# Patient Record
Sex: Male | Born: 1955
Health system: Southern US, Community
[De-identification: ages and names within clinical notes are randomized; demographics above are authoritative.]

## PROBLEM LIST (undated history)

## (undated) DIAGNOSIS — I10 Essential (primary) hypertension: Secondary | ICD-10-CM

---

## 1997-12-18 ENCOUNTER — Inpatient Hospital Stay (HOSPITAL_COMMUNITY): Admission: RE | Admit: 1997-12-18 | Discharge: 1997-12-22 | Payer: Self-pay | Admitting: Orthopedic Surgery

## 2001-02-25 ENCOUNTER — Ambulatory Visit (HOSPITAL_COMMUNITY): Admission: RE | Admit: 2001-02-25 | Discharge: 2001-02-25 | Payer: Self-pay | Admitting: Family Medicine

## 2001-02-25 ENCOUNTER — Encounter: Payer: Self-pay | Admitting: Family Medicine

## 2005-10-13 ENCOUNTER — Encounter: Admission: RE | Admit: 2005-10-13 | Discharge: 2005-10-13 | Payer: Self-pay | Admitting: Orthopedic Surgery

## 2014-01-17 ENCOUNTER — Ambulatory Visit: Payer: Self-pay

## 2017-02-06 ENCOUNTER — Other Ambulatory Visit: Payer: Self-pay | Admitting: Acute Care

## 2017-02-06 DIAGNOSIS — F17210 Nicotine dependence, cigarettes, uncomplicated: Secondary | ICD-10-CM

## 2017-02-10 ENCOUNTER — Encounter: Payer: Self-pay | Admitting: Acute Care

## 2017-02-10 ENCOUNTER — Ambulatory Visit (INDEPENDENT_AMBULATORY_CARE_PROVIDER_SITE_OTHER): Payer: Medicare Other | Admitting: Acute Care

## 2017-02-10 ENCOUNTER — Ambulatory Visit (INDEPENDENT_AMBULATORY_CARE_PROVIDER_SITE_OTHER)
Admission: RE | Admit: 2017-02-10 | Discharge: 2017-02-10 | Disposition: A | Discharge status: Home / Self Care | Payer: Medicare Other | Source: Ambulatory Visit | Attending: Acute Care | Admitting: Acute Care

## 2017-02-10 DIAGNOSIS — Z87891 Personal history of nicotine dependence: Secondary | ICD-10-CM

## 2017-02-10 DIAGNOSIS — F17210 Nicotine dependence, cigarettes, uncomplicated: Secondary | ICD-10-CM

## 2017-02-10 NOTE — Progress Notes (Signed)
Shared Decision Making Visit Lung Cancer Screening Program [phone])   Eligibility:  Age 61 y.o.  Pack Years Smoking History Calculation 75 pack year smoking histpry (# packs/per year x # years smoked)  Recent History of coughing up blood  no  Unexplained weight loss? no ( >Than 15 pounds within the last 6 months )  Prior History Lung / other cancer no (Diagnosis within the last 5 years already requiring surveillance chest CT Scans).  Smoking Status Current Smoker  Former Smokers: Years since quit: NA  Quit Date: NA  Visit Components:  Discussion included one or more decision making aids. yes  Discussion included risk/benefits of screening. yes  Discussion included potential follow up diagnostic testing for abnormal scans. yes  Discussion included meaning and risk of over diagnosis. yes  Discussion included meaning and risk of False Positives. yes  Discussion included meaning of total radiation exposure. yes  Counseling Included:  Importance of adherence to annual lung cancer LDCT screening. yes  Impact of comorbidities on ability to participate in the program. yes  Ability and willingness to under diagnostic treatment. yes  Smoking Cessation Counseling:  Current Smokers:   Discussed importance of smoking cessation. yes  Information about tobacco cessation classes and interventions provided to patient. yes  Patient provided with "ticket" for LDCT Scan. yes  Symptomatic Patient. no  Counseling  Diagnosis Code: Tobacco Use Z72.0  Asymptomatic Patient yes  Counseling (Intermediate counseling: > three minutes counseling) T0354  Former Smokers:   Discussed the importance of maintaining cigarette abstinence. yes  Diagnosis Code: Personal History of Nicotine Dependence. S56.812  Information about tobacco cessation classes and interventions provided to patient. Yes  Patient provided with "ticket" for LDCT Scan. yes  Written Order for Lung Cancer  Screening with LDCT placed in Epic. Yes (CT Chest Lung Cancer Screening Low Dose W/O CM) [id number] Z12.2-Screening of respiratory organs Z87.891-Personal history of nicotine dependence  I have spent 25 minutes of face to face time with Mr. Dimond discussing the risks and benefits of lung cancer screening. We viewed a power point together that explained in detail the above noted topics. We paused at intervals to allow for questions to be asked and answered to ensure understanding.We discussed that the single most powerful action that he can take to decrease his risk of developing lung cancer is to quit smoking. We discussed whether or not he is ready to commit to setting a quit date.He is currently not ready to set a quit date.  We discussed options for tools to aid in quitting smoking including nicotine replacement therapy, non-nicotine medications, support groups, Quit Smart classes, and behavior modification. We discussed that often times setting smaller, more achievable goals, such as eliminating 1 cigarette a day for a week and then 2 cigarettes a day for a week can be helpful in slowly decreasing the number of cigarettes smoked. This allows for a sense of accomplishment as well as providing a clinical benefit. I gave Mr. Twyford  the " Be Stronger Than Your Excuses" card with contact information for community resources, classes, free nicotine replacement therapy, and access to mobile apps, text messaging, and on-line smoking cessation help. I have also given him my card and contact information in the event he needs to contact me. We discussed the time and location of the scan, and that either Doroteo Glassman RN or I will call with the results within 24-48 hours of receiving them. I have offered him   a copy of the power point  we viewed  as a resource in the event they need reinforcement of the concepts we discussed today in the office. The patient verbalized understanding of all of  the above and had no  further questions upon leaving the office. They have my contact information in the event they have any further questions.  I spent 4 minutes counseling on smoking cessation and the health risks of continued tobacco abuse.  I explained to the patient that there has been a high incidence of coronary artery disease noted on these exams. I explained that this is a non-gated exam therefore degree or severity cannot be determined. This patient is not currently on statin therapy. I have asked the patient to follow-up with their PCP regarding any incidental finding of coronary artery disease and management with diet or medication as their PCP  feels is clinically indicated. The patient verbalized understanding of the above and had no further questions upon completion of the visit.    Magdalen Spatz, NP 02/10/2017

## 2017-02-16 NOTE — Progress Notes (Signed)
ATC pt - no answer and no option to LM.

## 2017-02-28 ENCOUNTER — Other Ambulatory Visit: Payer: Self-pay | Admitting: Acute Care

## 2017-02-28 ENCOUNTER — Encounter: Payer: Self-pay | Admitting: *Deleted

## 2017-02-28 DIAGNOSIS — Z122 Encounter for screening for malignant neoplasm of respiratory organs: Secondary | ICD-10-CM

## 2017-02-28 DIAGNOSIS — F17210 Nicotine dependence, cigarettes, uncomplicated: Secondary | ICD-10-CM

## 2017-04-05 ENCOUNTER — Other Ambulatory Visit: Payer: Self-pay | Admitting: Otolaryngology

## 2017-04-05 DIAGNOSIS — R221 Localized swelling, mass and lump, neck: Secondary | ICD-10-CM

## 2017-04-12 ENCOUNTER — Ambulatory Visit
Admission: RE | Admit: 2017-04-12 | Discharge: 2017-04-12 | Disposition: A | Discharge status: Home / Self Care | Payer: Medicare Other | Source: Ambulatory Visit | Attending: Otolaryngology | Admitting: Otolaryngology

## 2017-04-12 DIAGNOSIS — R221 Localized swelling, mass and lump, neck: Secondary | ICD-10-CM

## 2017-04-12 MED ORDER — IOPAMIDOL (ISOVUE-300) INJECTION 61%
75.0000 mL | Freq: Once | INTRAVENOUS | Status: AC | PRN
  Administered 2017-04-12: 75 mL via INTRAVENOUS

## 2017-07-31 DIAGNOSIS — R591 Generalized enlarged lymph nodes: Secondary | ICD-10-CM | POA: Diagnosis not present

## 2017-07-31 DIAGNOSIS — J449 Chronic obstructive pulmonary disease, unspecified: Secondary | ICD-10-CM | POA: Diagnosis not present

## 2017-07-31 DIAGNOSIS — F331 Major depressive disorder, recurrent, moderate: Secondary | ICD-10-CM | POA: Diagnosis not present

## 2018-07-19 DIAGNOSIS — D23122 Other benign neoplasm of skin of left lower eyelid, including canthus: Secondary | ICD-10-CM | POA: Diagnosis not present

## 2018-07-19 DIAGNOSIS — D23121 Other benign neoplasm of skin of left upper eyelid, including canthus: Secondary | ICD-10-CM | POA: Diagnosis not present

## 2018-08-08 DIAGNOSIS — I10 Essential (primary) hypertension: Secondary | ICD-10-CM | POA: Diagnosis not present

## 2018-08-08 DIAGNOSIS — M25551 Pain in right hip: Secondary | ICD-10-CM | POA: Diagnosis not present

## 2018-08-08 DIAGNOSIS — E785 Hyperlipidemia, unspecified: Secondary | ICD-10-CM | POA: Diagnosis not present

## 2018-08-08 DIAGNOSIS — Z125 Encounter for screening for malignant neoplasm of prostate: Secondary | ICD-10-CM | POA: Diagnosis not present

## 2018-08-08 DIAGNOSIS — J449 Chronic obstructive pulmonary disease, unspecified: Secondary | ICD-10-CM | POA: Diagnosis not present

## 2018-08-08 DIAGNOSIS — F419 Anxiety disorder, unspecified: Secondary | ICD-10-CM | POA: Diagnosis not present

## 2018-08-08 DIAGNOSIS — M5090 Cervical disc disorder, unspecified, unspecified cervical region: Secondary | ICD-10-CM | POA: Diagnosis not present

## 2018-08-08 DIAGNOSIS — F17200 Nicotine dependence, unspecified, uncomplicated: Secondary | ICD-10-CM | POA: Diagnosis not present

## 2018-08-08 DIAGNOSIS — R6889 Other general symptoms and signs: Secondary | ICD-10-CM | POA: Diagnosis not present

## 2018-08-10 ENCOUNTER — Other Ambulatory Visit: Payer: Self-pay | Admitting: Acute Care

## 2018-08-10 DIAGNOSIS — F17210 Nicotine dependence, cigarettes, uncomplicated: Secondary | ICD-10-CM

## 2018-08-10 DIAGNOSIS — Z87891 Personal history of nicotine dependence: Secondary | ICD-10-CM

## 2018-08-10 DIAGNOSIS — Z122 Encounter for screening for malignant neoplasm of respiratory organs: Secondary | ICD-10-CM

## 2018-08-13 DIAGNOSIS — D2310 Other benign neoplasm of skin of unspecified eyelid, including canthus: Secondary | ICD-10-CM | POA: Diagnosis not present

## 2018-08-13 DIAGNOSIS — D23121 Other benign neoplasm of skin of left upper eyelid, including canthus: Secondary | ICD-10-CM | POA: Diagnosis not present

## 2018-08-20 ENCOUNTER — Ambulatory Visit
Admission: RE | Admit: 2018-08-20 | Discharge: 2018-08-20 | Disposition: A | Discharge status: Home / Self Care | Payer: PPO | Source: Ambulatory Visit | Attending: Acute Care | Admitting: Acute Care

## 2018-08-20 ENCOUNTER — Telehealth: Payer: Self-pay | Admitting: Acute Care

## 2018-08-20 DIAGNOSIS — F17210 Nicotine dependence, cigarettes, uncomplicated: Secondary | ICD-10-CM

## 2018-08-20 DIAGNOSIS — Z87891 Personal history of nicotine dependence: Secondary | ICD-10-CM

## 2018-08-20 DIAGNOSIS — Z122 Encounter for screening for malignant neoplasm of respiratory organs: Secondary | ICD-10-CM

## 2018-08-20 NOTE — Telephone Encounter (Signed)
[facility] Radiology call report- CLINICAL DATA:  63 year old male current smoker, with 76 pack-year history of smoking, for follow-up lung cancer screening  EXAM: CT CHEST WITHOUT CONTRAST LOW-DOSE FOR LUNG CANCER SCREENING  TECHNIQUE: Multidetector CT imaging of the chest was performed following the standard protocol without IV contrast.  COMPARISON:  Low-dose lung cancer screening CT chest dated 02/10/2017  FINDINGS: Cardiovascular: Heart is normal in size.  No pericardial effusion.  No evidence of thoracic aortic aneurysm. Very mild atherosclerotic calcifications of the aortic arch.  Mild coronary atherosclerosis of the LAD.  Mediastinum/Nodes: No suspicious mediastinal lymphadenopathy.  Visualized thyroid is unremarkable.  Lungs/Pleura: Mild centrilobular and paraseptal emphysematous changes, upper lobe predominant.  No focal consolidation.  Two subpleural nodules in the left upper lobe measuring up to 2.5 mm.  No pleural effusion or pneumothorax.  Upper Abdomen: Visualized upper abdomen is notable for possible mild soft tissue prominence of the lateral left upper kidney (series 2/image 72), incompletely visualized.  Musculoskeletal: Degenerative changes of the visualized thoracolumbar spine.  IMPRESSION: Lung-RADS 2S, benign appearance or behavior. Continue annual screening with low-dose chest CT without contrast in 12 months.  A Lung-RADS "S" modifier is used due to the presence of a potentially clinically significant finding, in this case, soft tissue prominence of the lateral left upper kidney, incompletely visualized. Consider MR or CT abdomen with/without contrast for further evaluation to exclude a true renal mass.  These results will be called to the ordering clinician or representative by the Radiologist Assistant, and communication documented in the PACS or zVision Dashboard.  Aortic Atherosclerosis (ICD10-I70.0) and Emphysema  (ICD10-J43.9).   Electronically Signed   By: Julian Hy M.D.   On: 08/20/2018 15:27   Routing to [place] Form

## 2018-08-21 NOTE — Telephone Encounter (Signed)
I will call the patient tomorrow when I am in the office. Thanks

## 2018-08-21 NOTE — Telephone Encounter (Signed)
Pt is calling back [phone]

## 2018-08-21 NOTE — Telephone Encounter (Signed)
Sarah please advise. Thanks. 

## 2018-08-22 ENCOUNTER — Telehealth: Payer: Self-pay | Admitting: Acute Care

## 2018-08-22 NOTE — Telephone Encounter (Signed)
I have called the patient's PCP office, Dr. Chrystine Oiler, and spoken with [facility]. She is going to give a message to Dr. Ewing Schlein nurse . She states that they will follow up regarding imaging or diagnostics of the incidental finding for the left renal soft tissue prominence noted in the lung cancer screening scan. I will fax the results to the PCP office.Thanks so much.

## 2018-08-22 NOTE — Telephone Encounter (Signed)
Scott Knight, will you please fax results of the CT scan to the patient's PCP

## 2018-08-23 ENCOUNTER — Other Ambulatory Visit: Payer: Self-pay | Admitting: Acute Care

## 2018-08-23 DIAGNOSIS — Z122 Encounter for screening for malignant neoplasm of respiratory organs: Secondary | ICD-10-CM

## 2018-08-23 DIAGNOSIS — Z87891 Personal history of nicotine dependence: Secondary | ICD-10-CM

## 2018-08-23 DIAGNOSIS — F17210 Nicotine dependence, cigarettes, uncomplicated: Secondary | ICD-10-CM

## 2018-08-23 NOTE — Telephone Encounter (Signed)
Chest CT results faxed to Dr Maryland Pink.  Nothing further needed.

## 2018-08-23 NOTE — Telephone Encounter (Signed)
See result note 08/22/2018. Results faxed to Dr Maryland Pink.

## 2018-08-23 NOTE — Telephone Encounter (Signed)
Amy, Dr. Ewing Schlein office, is returning call. Per Amy, she did not receive the faxed results. Amy is requesting these results be refaxed to the ATTN of Amy.   CB is [phone] Fax [phone]

## 2018-08-23 NOTE — Telephone Encounter (Signed)
Results refaxed to Dr Hedrick's office.  Spoke with Amy and she verbalized that she received the fax.  Nothing further needed at this time.

## 2018-08-27 ENCOUNTER — Other Ambulatory Visit: Payer: Self-pay | Admitting: Family Medicine

## 2018-08-27 DIAGNOSIS — N281 Cyst of kidney, acquired: Secondary | ICD-10-CM

## 2018-08-27 DIAGNOSIS — R9389 Abnormal findings on diagnostic imaging of other specified body structures: Secondary | ICD-10-CM

## 2018-09-06 ENCOUNTER — Ambulatory Visit
Admission: RE | Admit: 2018-09-06 | Discharge: 2018-09-06 | Disposition: A | Discharge status: Home / Self Care | Payer: PPO | Source: Ambulatory Visit | Attending: Family Medicine | Admitting: Family Medicine

## 2018-09-06 DIAGNOSIS — N281 Cyst of kidney, acquired: Secondary | ICD-10-CM

## 2018-09-06 DIAGNOSIS — R9389 Abnormal findings on diagnostic imaging of other specified body structures: Secondary | ICD-10-CM | POA: Diagnosis not present

## 2018-09-06 LAB — POCT I-STAT CREATININE: Creatinine, Ser: 0.7 mg/dL (ref 0.61–1.24)

## 2018-09-07 DIAGNOSIS — I10 Essential (primary) hypertension: Secondary | ICD-10-CM | POA: Diagnosis not present

## 2018-09-07 DIAGNOSIS — Z125 Encounter for screening for malignant neoplasm of prostate: Secondary | ICD-10-CM | POA: Diagnosis not present

## 2018-09-07 DIAGNOSIS — R6889 Other general symptoms and signs: Secondary | ICD-10-CM | POA: Diagnosis not present

## 2018-09-07 DIAGNOSIS — E785 Hyperlipidemia, unspecified: Secondary | ICD-10-CM | POA: Diagnosis not present

## 2018-09-10 DIAGNOSIS — L720 Epidermal cyst: Secondary | ICD-10-CM | POA: Diagnosis not present

## 2018-09-10 DIAGNOSIS — D23121 Other benign neoplasm of skin of left upper eyelid, including canthus: Secondary | ICD-10-CM | POA: Diagnosis not present

## 2018-12-26 DIAGNOSIS — H0014 Chalazion left upper eyelid: Secondary | ICD-10-CM | POA: Diagnosis not present

## 2019-02-20 DIAGNOSIS — J449 Chronic obstructive pulmonary disease, unspecified: Secondary | ICD-10-CM | POA: Diagnosis not present

## 2019-02-20 DIAGNOSIS — M542 Cervicalgia: Secondary | ICD-10-CM | POA: Diagnosis not present

## 2019-02-20 DIAGNOSIS — Z125 Encounter for screening for malignant neoplasm of prostate: Secondary | ICD-10-CM | POA: Diagnosis not present

## 2019-02-20 DIAGNOSIS — F17200 Nicotine dependence, unspecified, uncomplicated: Secondary | ICD-10-CM | POA: Diagnosis not present

## 2019-02-20 DIAGNOSIS — I10 Essential (primary) hypertension: Secondary | ICD-10-CM | POA: Diagnosis not present

## 2019-02-20 DIAGNOSIS — E785 Hyperlipidemia, unspecified: Secondary | ICD-10-CM | POA: Diagnosis not present

## 2019-02-20 DIAGNOSIS — Z0000 Encounter for general adult medical examination without abnormal findings: Secondary | ICD-10-CM | POA: Diagnosis not present

## 2019-02-20 DIAGNOSIS — F419 Anxiety disorder, unspecified: Secondary | ICD-10-CM | POA: Diagnosis not present

## 2019-02-20 DIAGNOSIS — G8929 Other chronic pain: Secondary | ICD-10-CM | POA: Diagnosis not present

## 2019-02-20 DIAGNOSIS — M25551 Pain in right hip: Secondary | ICD-10-CM | POA: Diagnosis not present

## 2019-10-04 DIAGNOSIS — J019 Acute sinusitis, unspecified: Secondary | ICD-10-CM | POA: Diagnosis not present

## 2020-02-17 DIAGNOSIS — I10 Essential (primary) hypertension: Secondary | ICD-10-CM | POA: Diagnosis not present

## 2020-02-17 DIAGNOSIS — E785 Hyperlipidemia, unspecified: Secondary | ICD-10-CM | POA: Diagnosis not present

## 2020-02-17 DIAGNOSIS — Z125 Encounter for screening for malignant neoplasm of prostate: Secondary | ICD-10-CM | POA: Diagnosis not present

## 2020-02-24 DIAGNOSIS — Z23 Encounter for immunization: Secondary | ICD-10-CM | POA: Diagnosis not present

## 2020-02-24 DIAGNOSIS — F419 Anxiety disorder, unspecified: Secondary | ICD-10-CM | POA: Diagnosis not present

## 2020-02-24 DIAGNOSIS — J449 Chronic obstructive pulmonary disease, unspecified: Secondary | ICD-10-CM | POA: Diagnosis not present

## 2020-02-24 DIAGNOSIS — E785 Hyperlipidemia, unspecified: Secondary | ICD-10-CM | POA: Diagnosis not present

## 2020-02-24 DIAGNOSIS — Z125 Encounter for screening for malignant neoplasm of prostate: Secondary | ICD-10-CM | POA: Diagnosis not present

## 2020-02-24 DIAGNOSIS — I10 Essential (primary) hypertension: Secondary | ICD-10-CM | POA: Diagnosis not present

## 2020-02-24 DIAGNOSIS — Z0000 Encounter for general adult medical examination without abnormal findings: Secondary | ICD-10-CM | POA: Diagnosis not present

## 2020-11-10 DIAGNOSIS — M25551 Pain in right hip: Secondary | ICD-10-CM | POA: Diagnosis not present

## 2020-11-10 DIAGNOSIS — M542 Cervicalgia: Secondary | ICD-10-CM | POA: Diagnosis not present

## 2021-01-07 IMAGING — CT CT CHEST LUNG CANCER SCREENING LOW DOSE W/O CM
2 of 5 series · 15 of 40 positions shown, 18 images · non-contrast
Comparison: Low-dose lung cancer screening CT chest dated
02/10/2017

CLINICAL DATA: 62-year-old male current smoker, with 77 pack-year
history of smoking, for follow-up lung cancer screening

EXAM:
CT CHEST WITHOUT CONTRAST LOW-DOSE FOR LUNG CANCER SCREENING
TECHNIQUE: Multidetector CT imaging of the chest was performed following the
standard protocol without IV contrast.

[Series 4: lung 1.00 br44 cor · coronal · 0.70mm/px · 3 of 311 slices shown]
[im 63/311  lung]
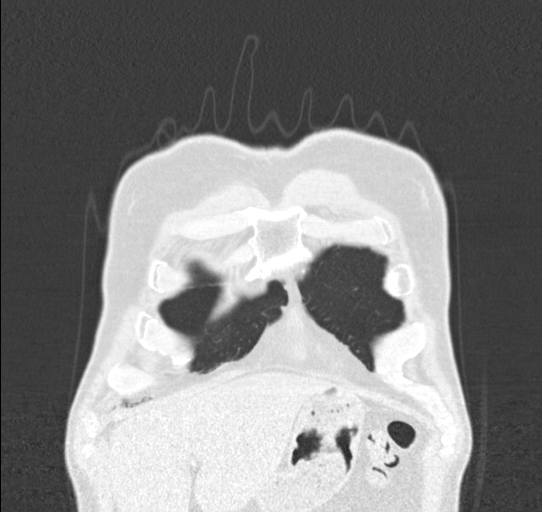
[im 125/311  lung]
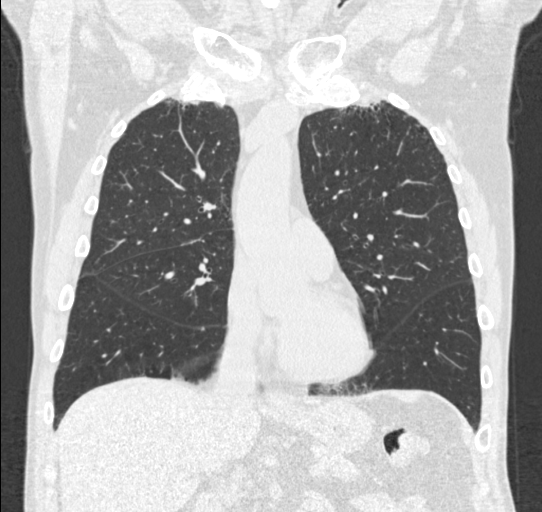
[im 187/311  lung]
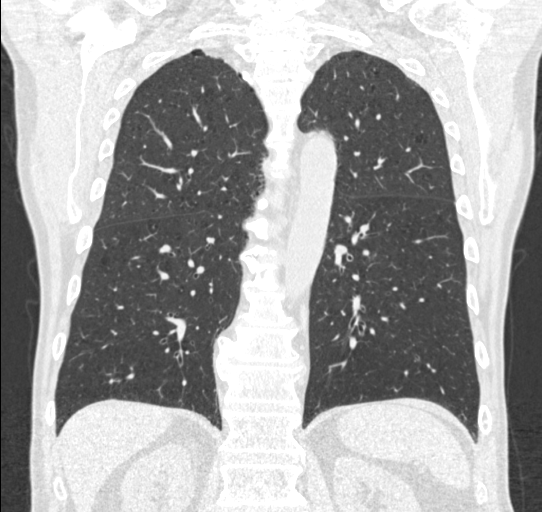

[Series 9: lung 1.00 br60 · axial · 0.74mm/px · z∈[-1035,-709]mm · 12 of 360 slices shown, 15 images]
[im 17/360  mediastinal]
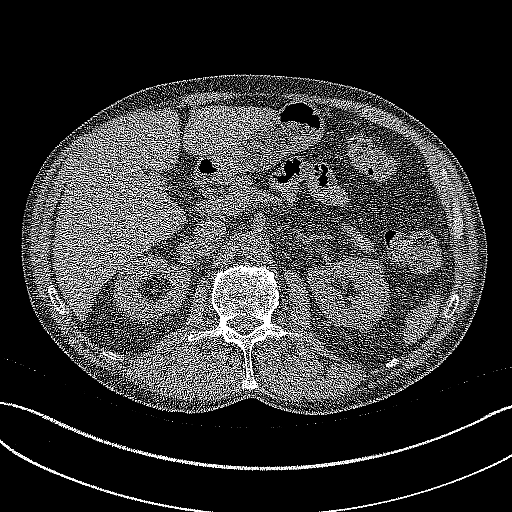
[im 17/360  lung]
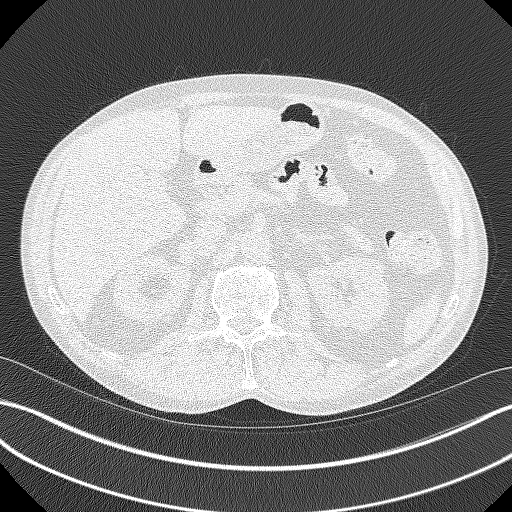
[im 49/360  lung]
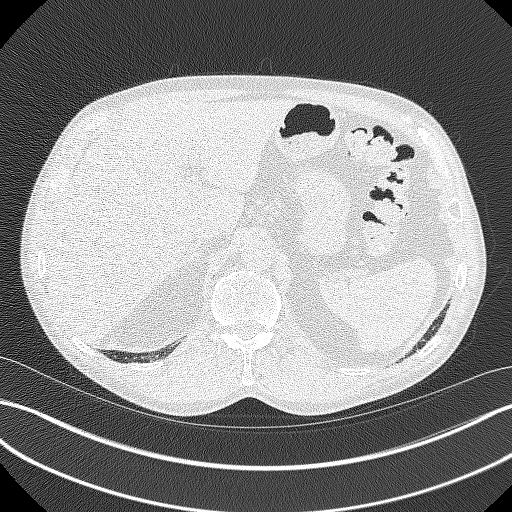
[im 82/360  lung]
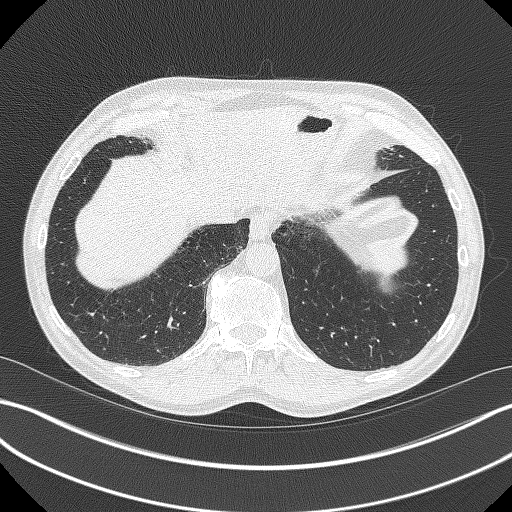
[im 115/360  lung]
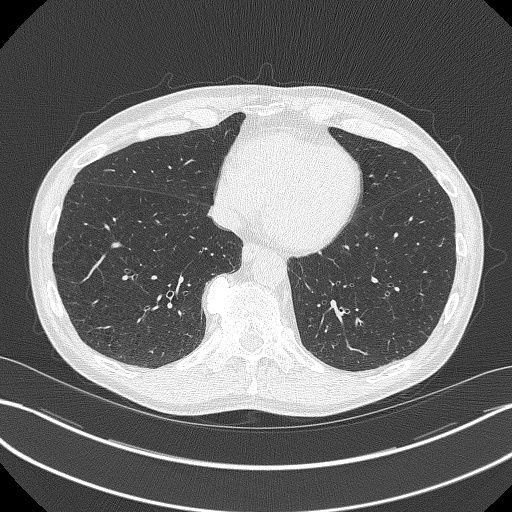
[im 131/360  mediastinal]
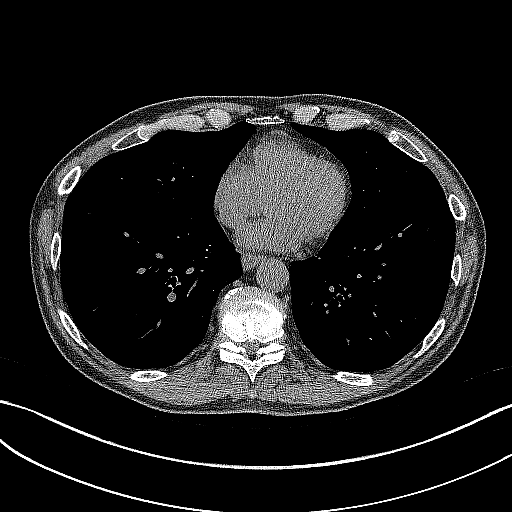
[im 131/360  lung]
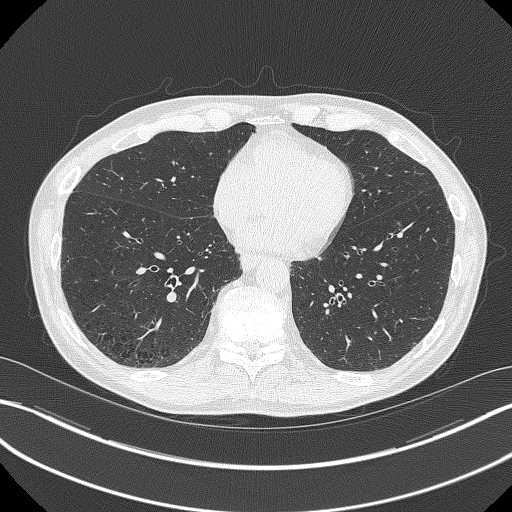
[im 164/360  lung]
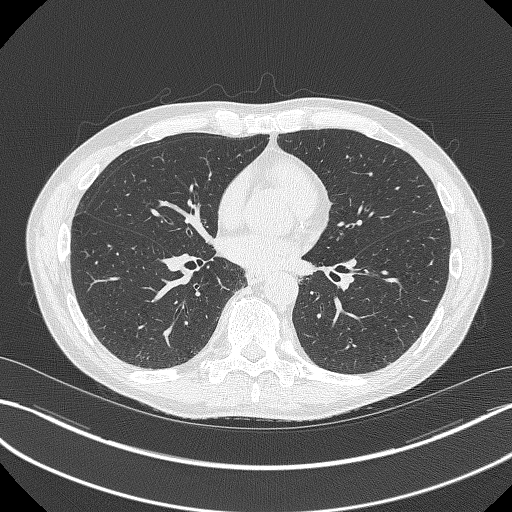
[im 196/360  lung]
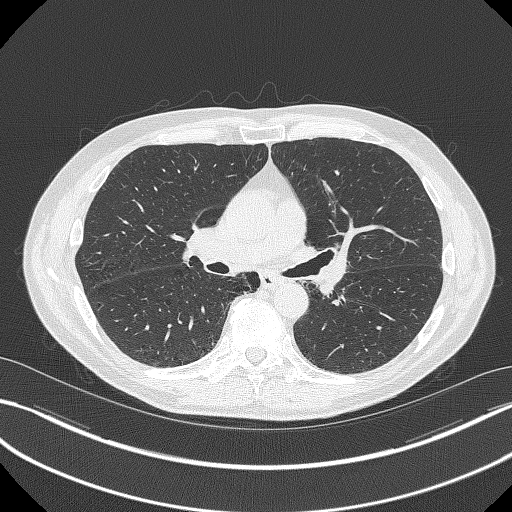
[im 229/360  lung]
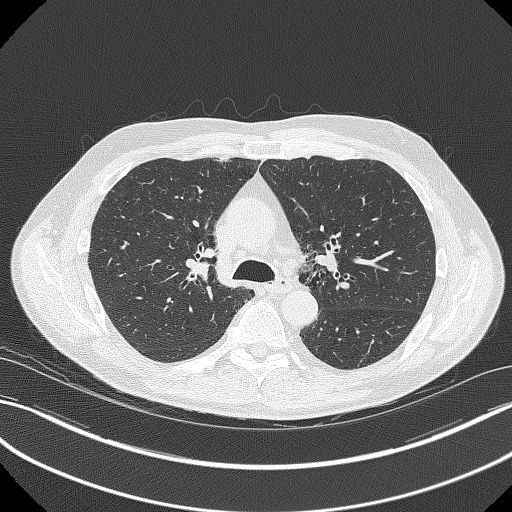
[im 245/360  mediastinal]
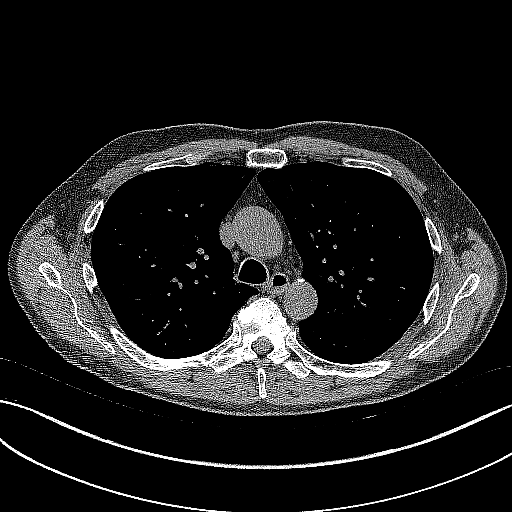
[im 245/360  lung]
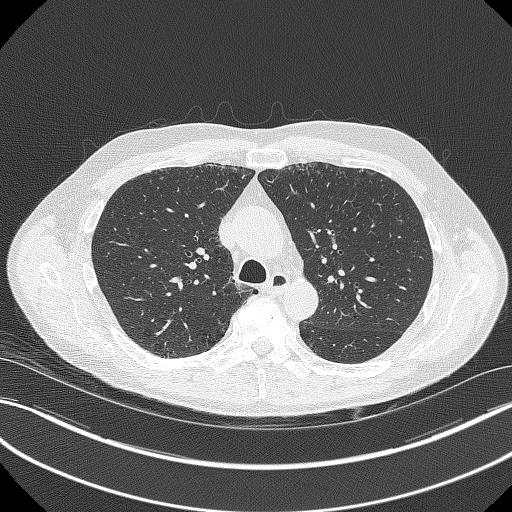
[im 278/360  lung]
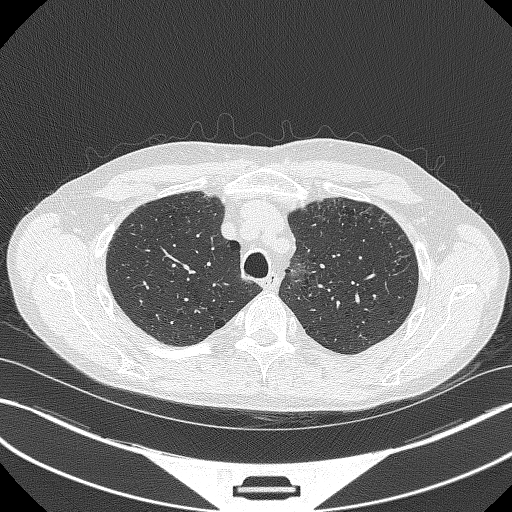
[im 311/360  lung]
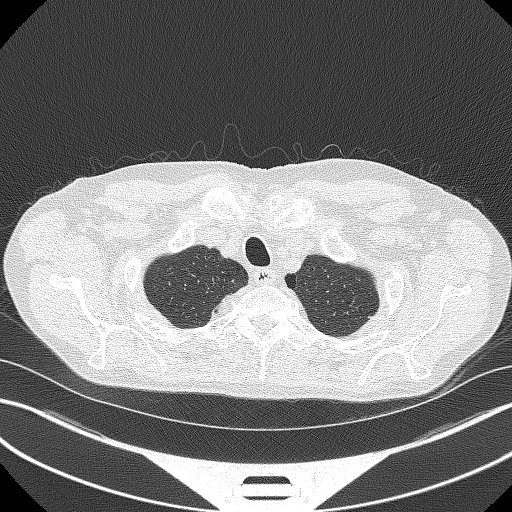
[im 343/360  lung]
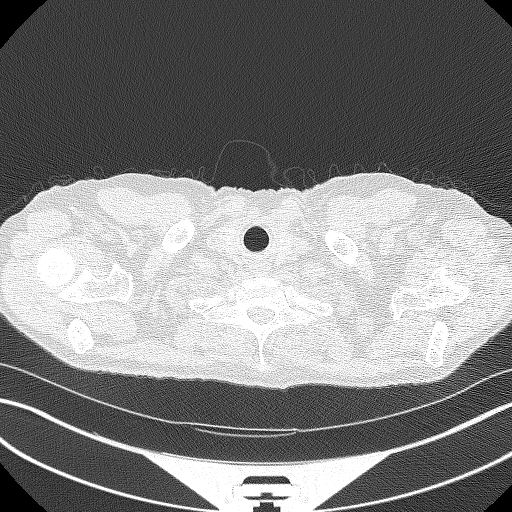

[15 of 40 positions shown; findings below may reference images not displayed]

FINDINGS: Cardiovascular: Heart is normal in size.  No pericardial effusion.

No evidence of thoracic aortic aneurysm. Very mild atherosclerotic
calcifications of the aortic arch.

Mild coronary atherosclerosis of the LAD.

Mediastinum/Nodes: No suspicious mediastinal lymphadenopathy.

Visualized thyroid is unremarkable.

Lungs/Pleura: Mild centrilobular and paraseptal emphysematous
changes, upper lobe predominant.

No focal consolidation.

Two subpleural nodules in the left upper lobe measuring up to
mm.

No pleural effusion or pneumothorax.

Upper Abdomen: Visualized upper abdomen is notable for possible mild
soft tissue prominence of the lateral left upper kidney (series
2/image 72), incompletely visualized.

Musculoskeletal: Degenerative changes of the visualized
thoracolumbar spine.
IMPRESSION: Lung-RADS 2S, benign appearance or behavior. Continue annual
screening with low-dose chest CT without contrast in 12 months.

A Lung-RADS "S" modifier is used due to the presence of a
potentially clinically significant finding, in this case, soft
tissue prominence of the lateral left upper kidney, incompletely
visualized. Consider MR or CT abdomen with/without contrast for
further evaluation to exclude a true renal mass.

These results will be called to the ordering clinician or
representative by the Radiologist Assistant, and communication
documented in the PACS or zVision Dashboard.

Aortic Atherosclerosis (87KMU-XXW.W) and Emphysema (87KMU-0OA.J).

## 2021-02-17 DIAGNOSIS — Z125 Encounter for screening for malignant neoplasm of prostate: Secondary | ICD-10-CM | POA: Diagnosis not present

## 2021-02-17 DIAGNOSIS — I10 Essential (primary) hypertension: Secondary | ICD-10-CM | POA: Diagnosis not present

## 2021-02-24 DIAGNOSIS — Z125 Encounter for screening for malignant neoplasm of prostate: Secondary | ICD-10-CM | POA: Diagnosis not present

## 2021-02-24 DIAGNOSIS — F17200 Nicotine dependence, unspecified, uncomplicated: Secondary | ICD-10-CM | POA: Diagnosis not present

## 2021-02-24 DIAGNOSIS — M542 Cervicalgia: Secondary | ICD-10-CM | POA: Diagnosis not present

## 2021-02-24 DIAGNOSIS — F331 Major depressive disorder, recurrent, moderate: Secondary | ICD-10-CM | POA: Diagnosis not present

## 2021-02-24 DIAGNOSIS — Z1211 Encounter for screening for malignant neoplasm of colon: Secondary | ICD-10-CM | POA: Diagnosis not present

## 2021-02-24 DIAGNOSIS — E785 Hyperlipidemia, unspecified: Secondary | ICD-10-CM | POA: Diagnosis not present

## 2021-02-24 DIAGNOSIS — J449 Chronic obstructive pulmonary disease, unspecified: Secondary | ICD-10-CM | POA: Diagnosis not present

## 2021-02-24 DIAGNOSIS — I10 Essential (primary) hypertension: Secondary | ICD-10-CM | POA: Diagnosis not present

## 2021-02-24 DIAGNOSIS — Z0000 Encounter for general adult medical examination without abnormal findings: Secondary | ICD-10-CM | POA: Diagnosis not present

## 2021-03-22 DIAGNOSIS — Z1211 Encounter for screening for malignant neoplasm of colon: Secondary | ICD-10-CM | POA: Diagnosis not present

## 2021-03-26 LAB — COLOGUARD: COLOGUARD: POSITIVE — AB

## 2021-04-26 DIAGNOSIS — H2513 Age-related nuclear cataract, bilateral: Secondary | ICD-10-CM | POA: Diagnosis not present

## 2021-04-26 DIAGNOSIS — H5213 Myopia, bilateral: Secondary | ICD-10-CM | POA: Diagnosis not present

## 2022-02-22 DIAGNOSIS — E785 Hyperlipidemia, unspecified: Secondary | ICD-10-CM | POA: Diagnosis not present

## 2022-02-22 DIAGNOSIS — I10 Essential (primary) hypertension: Secondary | ICD-10-CM | POA: Diagnosis not present

## 2022-02-22 DIAGNOSIS — Z125 Encounter for screening for malignant neoplasm of prostate: Secondary | ICD-10-CM | POA: Diagnosis not present

## 2022-03-01 DIAGNOSIS — M542 Cervicalgia: Secondary | ICD-10-CM | POA: Diagnosis not present

## 2022-03-01 DIAGNOSIS — Z0000 Encounter for general adult medical examination without abnormal findings: Secondary | ICD-10-CM | POA: Diagnosis not present

## 2022-03-01 DIAGNOSIS — E785 Hyperlipidemia, unspecified: Secondary | ICD-10-CM | POA: Diagnosis not present

## 2022-03-01 DIAGNOSIS — M87051 Idiopathic aseptic necrosis of right femur: Secondary | ICD-10-CM | POA: Diagnosis not present

## 2022-03-01 DIAGNOSIS — M25551 Pain in right hip: Secondary | ICD-10-CM | POA: Diagnosis not present

## 2022-03-01 DIAGNOSIS — J449 Chronic obstructive pulmonary disease, unspecified: Secondary | ICD-10-CM | POA: Diagnosis not present

## 2022-03-01 DIAGNOSIS — F331 Major depressive disorder, recurrent, moderate: Secondary | ICD-10-CM | POA: Diagnosis not present

## 2022-03-01 DIAGNOSIS — I10 Essential (primary) hypertension: Secondary | ICD-10-CM | POA: Diagnosis not present

## 2022-03-24 DIAGNOSIS — M4802 Spinal stenosis, cervical region: Secondary | ICD-10-CM | POA: Diagnosis not present

## 2022-03-24 DIAGNOSIS — M1611 Unilateral primary osteoarthritis, right hip: Secondary | ICD-10-CM | POA: Diagnosis not present

## 2022-03-24 DIAGNOSIS — M47812 Spondylosis without myelopathy or radiculopathy, cervical region: Secondary | ICD-10-CM | POA: Diagnosis not present

## 2022-04-28 DIAGNOSIS — H2513 Age-related nuclear cataract, bilateral: Secondary | ICD-10-CM | POA: Diagnosis not present

## 2022-04-28 DIAGNOSIS — H5213 Myopia, bilateral: Secondary | ICD-10-CM | POA: Diagnosis not present

## 2022-04-28 DIAGNOSIS — H35371 Puckering of macula, right eye: Secondary | ICD-10-CM | POA: Diagnosis not present

## 2023-02-28 DIAGNOSIS — E785 Hyperlipidemia, unspecified: Secondary | ICD-10-CM | POA: Diagnosis not present

## 2023-02-28 DIAGNOSIS — I10 Essential (primary) hypertension: Secondary | ICD-10-CM | POA: Diagnosis not present

## 2023-02-28 DIAGNOSIS — Z125 Encounter for screening for malignant neoplasm of prostate: Secondary | ICD-10-CM | POA: Diagnosis not present

## 2023-03-07 DIAGNOSIS — F331 Major depressive disorder, recurrent, moderate: Secondary | ICD-10-CM | POA: Diagnosis not present

## 2023-03-07 DIAGNOSIS — J449 Chronic obstructive pulmonary disease, unspecified: Secondary | ICD-10-CM | POA: Diagnosis not present

## 2023-03-07 DIAGNOSIS — G4700 Insomnia, unspecified: Secondary | ICD-10-CM | POA: Diagnosis not present

## 2023-03-07 DIAGNOSIS — R351 Nocturia: Secondary | ICD-10-CM | POA: Diagnosis not present

## 2023-03-07 DIAGNOSIS — Z0000 Encounter for general adult medical examination without abnormal findings: Secondary | ICD-10-CM | POA: Diagnosis not present

## 2023-03-07 DIAGNOSIS — E785 Hyperlipidemia, unspecified: Secondary | ICD-10-CM | POA: Diagnosis not present

## 2023-03-07 DIAGNOSIS — F419 Anxiety disorder, unspecified: Secondary | ICD-10-CM | POA: Diagnosis not present

## 2023-03-07 DIAGNOSIS — Z125 Encounter for screening for malignant neoplasm of prostate: Secondary | ICD-10-CM | POA: Diagnosis not present

## 2023-03-07 DIAGNOSIS — I10 Essential (primary) hypertension: Secondary | ICD-10-CM | POA: Diagnosis not present

## 2023-03-07 DIAGNOSIS — F17200 Nicotine dependence, unspecified, uncomplicated: Secondary | ICD-10-CM | POA: Diagnosis not present

## 2023-03-07 DIAGNOSIS — K219 Gastro-esophageal reflux disease without esophagitis: Secondary | ICD-10-CM | POA: Diagnosis not present

## 2023-04-20 DIAGNOSIS — M25552 Pain in left hip: Secondary | ICD-10-CM | POA: Diagnosis not present

## 2023-04-20 DIAGNOSIS — M545 Low back pain, unspecified: Secondary | ICD-10-CM | POA: Diagnosis not present

## 2023-05-09 DIAGNOSIS — H2513 Age-related nuclear cataract, bilateral: Secondary | ICD-10-CM | POA: Diagnosis not present

## 2023-05-09 DIAGNOSIS — H524 Presbyopia: Secondary | ICD-10-CM | POA: Diagnosis not present

## 2023-06-05 DIAGNOSIS — D23112 Other benign neoplasm of skin of right lower eyelid, including canthus: Secondary | ICD-10-CM | POA: Diagnosis not present

## 2023-06-07 DIAGNOSIS — M25551 Pain in right hip: Secondary | ICD-10-CM | POA: Diagnosis not present

## 2023-06-07 DIAGNOSIS — G8929 Other chronic pain: Secondary | ICD-10-CM | POA: Diagnosis not present

## 2023-06-07 DIAGNOSIS — M542 Cervicalgia: Secondary | ICD-10-CM | POA: Diagnosis not present

## 2023-06-07 DIAGNOSIS — Z6822 Body mass index (BMI) 22.0-22.9, adult: Secondary | ICD-10-CM | POA: Diagnosis not present

## 2023-06-15 DIAGNOSIS — Z7689 Persons encountering health services in other specified circumstances: Secondary | ICD-10-CM | POA: Diagnosis not present

## 2023-06-15 DIAGNOSIS — E663 Overweight: Secondary | ICD-10-CM | POA: Diagnosis not present

## 2023-06-15 DIAGNOSIS — Z1159 Encounter for screening for other viral diseases: Secondary | ICD-10-CM | POA: Diagnosis not present

## 2023-06-15 DIAGNOSIS — F17210 Nicotine dependence, cigarettes, uncomplicated: Secondary | ICD-10-CM | POA: Diagnosis not present

## 2023-06-15 DIAGNOSIS — M25551 Pain in right hip: Secondary | ICD-10-CM | POA: Diagnosis not present

## 2023-06-15 DIAGNOSIS — G8929 Other chronic pain: Secondary | ICD-10-CM | POA: Diagnosis not present

## 2023-06-15 DIAGNOSIS — E559 Vitamin D deficiency, unspecified: Secondary | ICD-10-CM | POA: Diagnosis not present

## 2023-06-15 DIAGNOSIS — Z79899 Other long term (current) drug therapy: Secondary | ICD-10-CM | POA: Diagnosis not present

## 2023-06-15 DIAGNOSIS — Z131 Encounter for screening for diabetes mellitus: Secondary | ICD-10-CM | POA: Diagnosis not present

## 2023-06-15 DIAGNOSIS — M129 Arthropathy, unspecified: Secondary | ICD-10-CM | POA: Diagnosis not present

## 2023-06-15 DIAGNOSIS — M542 Cervicalgia: Secondary | ICD-10-CM | POA: Diagnosis not present

## 2023-06-19 DIAGNOSIS — Z79899 Other long term (current) drug therapy: Secondary | ICD-10-CM | POA: Diagnosis not present

## 2023-06-20 DIAGNOSIS — Z6824 Body mass index (BMI) 24.0-24.9, adult: Secondary | ICD-10-CM | POA: Diagnosis not present

## 2023-06-20 DIAGNOSIS — M25551 Pain in right hip: Secondary | ICD-10-CM | POA: Diagnosis not present

## 2023-06-20 DIAGNOSIS — Z79899 Other long term (current) drug therapy: Secondary | ICD-10-CM | POA: Diagnosis not present

## 2023-06-20 DIAGNOSIS — G8929 Other chronic pain: Secondary | ICD-10-CM | POA: Diagnosis not present

## 2023-06-20 DIAGNOSIS — M542 Cervicalgia: Secondary | ICD-10-CM | POA: Diagnosis not present

## 2023-06-22 DIAGNOSIS — Z79899 Other long term (current) drug therapy: Secondary | ICD-10-CM | POA: Diagnosis not present

## 2023-07-07 DIAGNOSIS — G894 Chronic pain syndrome: Secondary | ICD-10-CM | POA: Diagnosis not present

## 2023-07-07 DIAGNOSIS — R1084 Generalized abdominal pain: Secondary | ICD-10-CM | POA: Diagnosis not present

## 2023-07-18 DIAGNOSIS — G8929 Other chronic pain: Secondary | ICD-10-CM | POA: Diagnosis not present

## 2023-07-18 DIAGNOSIS — Z79899 Other long term (current) drug therapy: Secondary | ICD-10-CM | POA: Diagnosis not present

## 2023-07-18 DIAGNOSIS — R892 Abnormal level of other drugs, medicaments and biological substances in specimens from other organs, systems and tissues: Secondary | ICD-10-CM | POA: Diagnosis not present

## 2023-07-18 DIAGNOSIS — Z0000 Encounter for general adult medical examination without abnormal findings: Secondary | ICD-10-CM | POA: Diagnosis not present

## 2023-07-18 DIAGNOSIS — F17210 Nicotine dependence, cigarettes, uncomplicated: Secondary | ICD-10-CM | POA: Diagnosis not present

## 2023-07-18 DIAGNOSIS — Z6824 Body mass index (BMI) 24.0-24.9, adult: Secondary | ICD-10-CM | POA: Diagnosis not present

## 2023-07-18 DIAGNOSIS — M542 Cervicalgia: Secondary | ICD-10-CM | POA: Diagnosis not present

## 2023-07-18 DIAGNOSIS — M25551 Pain in right hip: Secondary | ICD-10-CM | POA: Diagnosis not present

## 2023-07-20 DIAGNOSIS — Z79899 Other long term (current) drug therapy: Secondary | ICD-10-CM | POA: Diagnosis not present

## 2023-08-15 DIAGNOSIS — F1290 Cannabis use, unspecified, uncomplicated: Secondary | ICD-10-CM | POA: Diagnosis not present

## 2023-08-15 DIAGNOSIS — G8929 Other chronic pain: Secondary | ICD-10-CM | POA: Diagnosis not present

## 2023-08-15 DIAGNOSIS — F17210 Nicotine dependence, cigarettes, uncomplicated: Secondary | ICD-10-CM | POA: Diagnosis not present

## 2023-08-15 DIAGNOSIS — Z6824 Body mass index (BMI) 24.0-24.9, adult: Secondary | ICD-10-CM | POA: Diagnosis not present

## 2023-08-15 DIAGNOSIS — M25551 Pain in right hip: Secondary | ICD-10-CM | POA: Diagnosis not present

## 2023-08-15 DIAGNOSIS — M542 Cervicalgia: Secondary | ICD-10-CM | POA: Diagnosis not present

## 2023-08-15 DIAGNOSIS — Z79899 Other long term (current) drug therapy: Secondary | ICD-10-CM | POA: Diagnosis not present

## 2023-08-21 DIAGNOSIS — Z79899 Other long term (current) drug therapy: Secondary | ICD-10-CM | POA: Diagnosis not present

## 2023-09-12 DIAGNOSIS — F17210 Nicotine dependence, cigarettes, uncomplicated: Secondary | ICD-10-CM | POA: Diagnosis not present

## 2023-09-12 DIAGNOSIS — G8929 Other chronic pain: Secondary | ICD-10-CM | POA: Diagnosis not present

## 2023-09-12 DIAGNOSIS — Z6824 Body mass index (BMI) 24.0-24.9, adult: Secondary | ICD-10-CM | POA: Diagnosis not present

## 2023-09-12 DIAGNOSIS — M25551 Pain in right hip: Secondary | ICD-10-CM | POA: Diagnosis not present

## 2023-09-12 DIAGNOSIS — Z79899 Other long term (current) drug therapy: Secondary | ICD-10-CM | POA: Diagnosis not present

## 2023-09-12 DIAGNOSIS — F1290 Cannabis use, unspecified, uncomplicated: Secondary | ICD-10-CM | POA: Diagnosis not present

## 2023-09-12 DIAGNOSIS — M542 Cervicalgia: Secondary | ICD-10-CM | POA: Diagnosis not present

## 2023-09-15 DIAGNOSIS — Z79899 Other long term (current) drug therapy: Secondary | ICD-10-CM | POA: Diagnosis not present

## 2023-10-10 DIAGNOSIS — F1290 Cannabis use, unspecified, uncomplicated: Secondary | ICD-10-CM | POA: Diagnosis not present

## 2023-10-10 DIAGNOSIS — M542 Cervicalgia: Secondary | ICD-10-CM | POA: Diagnosis not present

## 2023-10-10 DIAGNOSIS — M25551 Pain in right hip: Secondary | ICD-10-CM | POA: Diagnosis not present

## 2023-10-10 DIAGNOSIS — Z87891 Personal history of nicotine dependence: Secondary | ICD-10-CM | POA: Diagnosis not present

## 2023-10-10 DIAGNOSIS — F17210 Nicotine dependence, cigarettes, uncomplicated: Secondary | ICD-10-CM | POA: Diagnosis not present

## 2023-10-10 DIAGNOSIS — G8929 Other chronic pain: Secondary | ICD-10-CM | POA: Diagnosis not present

## 2023-10-10 DIAGNOSIS — Z79899 Other long term (current) drug therapy: Secondary | ICD-10-CM | POA: Diagnosis not present

## 2023-10-10 DIAGNOSIS — Z6824 Body mass index (BMI) 24.0-24.9, adult: Secondary | ICD-10-CM | POA: Diagnosis not present

## 2023-10-12 DIAGNOSIS — Z79899 Other long term (current) drug therapy: Secondary | ICD-10-CM | POA: Diagnosis not present

## 2023-10-25 DIAGNOSIS — Z87891 Personal history of nicotine dependence: Secondary | ICD-10-CM | POA: Diagnosis not present

## 2023-11-07 DIAGNOSIS — F17200 Nicotine dependence, unspecified, uncomplicated: Secondary | ICD-10-CM | POA: Diagnosis not present

## 2023-11-07 DIAGNOSIS — J449 Chronic obstructive pulmonary disease, unspecified: Secondary | ICD-10-CM | POA: Diagnosis not present

## 2023-11-07 DIAGNOSIS — M2550 Pain in unspecified joint: Secondary | ICD-10-CM | POA: Diagnosis not present

## 2023-11-10 DIAGNOSIS — M25551 Pain in right hip: Secondary | ICD-10-CM | POA: Diagnosis not present

## 2023-11-10 DIAGNOSIS — F17210 Nicotine dependence, cigarettes, uncomplicated: Secondary | ICD-10-CM | POA: Diagnosis not present

## 2023-11-10 DIAGNOSIS — Z79899 Other long term (current) drug therapy: Secondary | ICD-10-CM | POA: Diagnosis not present

## 2023-11-10 DIAGNOSIS — G8929 Other chronic pain: Secondary | ICD-10-CM | POA: Diagnosis not present

## 2023-11-10 DIAGNOSIS — M542 Cervicalgia: Secondary | ICD-10-CM | POA: Diagnosis not present

## 2023-11-15 DIAGNOSIS — Z79899 Other long term (current) drug therapy: Secondary | ICD-10-CM | POA: Diagnosis not present

## 2023-12-05 DIAGNOSIS — M25551 Pain in right hip: Secondary | ICD-10-CM | POA: Diagnosis not present

## 2023-12-05 DIAGNOSIS — F17210 Nicotine dependence, cigarettes, uncomplicated: Secondary | ICD-10-CM | POA: Diagnosis not present

## 2023-12-05 DIAGNOSIS — F1290 Cannabis use, unspecified, uncomplicated: Secondary | ICD-10-CM | POA: Diagnosis not present

## 2023-12-05 DIAGNOSIS — Z6824 Body mass index (BMI) 24.0-24.9, adult: Secondary | ICD-10-CM | POA: Diagnosis not present

## 2023-12-05 DIAGNOSIS — G8929 Other chronic pain: Secondary | ICD-10-CM | POA: Diagnosis not present

## 2023-12-05 DIAGNOSIS — M5489 Other dorsalgia: Secondary | ICD-10-CM | POA: Diagnosis not present

## 2023-12-05 DIAGNOSIS — M542 Cervicalgia: Secondary | ICD-10-CM | POA: Diagnosis not present

## 2023-12-05 DIAGNOSIS — Z79899 Other long term (current) drug therapy: Secondary | ICD-10-CM | POA: Diagnosis not present

## 2023-12-07 DIAGNOSIS — Z79899 Other long term (current) drug therapy: Secondary | ICD-10-CM | POA: Diagnosis not present

## 2023-12-13 DIAGNOSIS — K047 Periapical abscess without sinus: Secondary | ICD-10-CM | POA: Diagnosis not present

## 2023-12-13 DIAGNOSIS — J449 Chronic obstructive pulmonary disease, unspecified: Secondary | ICD-10-CM | POA: Diagnosis not present

## 2024-01-02 DIAGNOSIS — F17210 Nicotine dependence, cigarettes, uncomplicated: Secondary | ICD-10-CM | POA: Diagnosis not present

## 2024-01-02 DIAGNOSIS — M542 Cervicalgia: Secondary | ICD-10-CM | POA: Diagnosis not present

## 2024-01-02 DIAGNOSIS — M25551 Pain in right hip: Secondary | ICD-10-CM | POA: Diagnosis not present

## 2024-01-02 DIAGNOSIS — E559 Vitamin D deficiency, unspecified: Secondary | ICD-10-CM | POA: Diagnosis not present

## 2024-01-02 DIAGNOSIS — M129 Arthropathy, unspecified: Secondary | ICD-10-CM | POA: Diagnosis not present

## 2024-01-02 DIAGNOSIS — G8929 Other chronic pain: Secondary | ICD-10-CM | POA: Diagnosis not present

## 2024-01-02 DIAGNOSIS — Z79899 Other long term (current) drug therapy: Secondary | ICD-10-CM | POA: Diagnosis not present

## 2024-01-02 DIAGNOSIS — F1290 Cannabis use, unspecified, uncomplicated: Secondary | ICD-10-CM | POA: Diagnosis not present

## 2024-01-02 DIAGNOSIS — M5489 Other dorsalgia: Secondary | ICD-10-CM | POA: Diagnosis not present

## 2024-01-02 DIAGNOSIS — Z131 Encounter for screening for diabetes mellitus: Secondary | ICD-10-CM | POA: Diagnosis not present

## 2024-01-02 DIAGNOSIS — Z6823 Body mass index (BMI) 23.0-23.9, adult: Secondary | ICD-10-CM | POA: Diagnosis not present

## 2024-01-04 DIAGNOSIS — Z79899 Other long term (current) drug therapy: Secondary | ICD-10-CM | POA: Diagnosis not present

## 2024-01-30 DIAGNOSIS — R0602 Shortness of breath: Secondary | ICD-10-CM | POA: Diagnosis not present

## 2024-01-30 DIAGNOSIS — F17210 Nicotine dependence, cigarettes, uncomplicated: Secondary | ICD-10-CM | POA: Diagnosis not present

## 2024-01-30 DIAGNOSIS — M5489 Other dorsalgia: Secondary | ICD-10-CM | POA: Diagnosis not present

## 2024-01-30 DIAGNOSIS — R768 Other specified abnormal immunological findings in serum: Secondary | ICD-10-CM | POA: Diagnosis not present

## 2024-01-30 DIAGNOSIS — Z6823 Body mass index (BMI) 23.0-23.9, adult: Secondary | ICD-10-CM | POA: Diagnosis not present

## 2024-01-30 DIAGNOSIS — M25551 Pain in right hip: Secondary | ICD-10-CM | POA: Diagnosis not present

## 2024-01-30 DIAGNOSIS — F1290 Cannabis use, unspecified, uncomplicated: Secondary | ICD-10-CM | POA: Diagnosis not present

## 2024-01-30 DIAGNOSIS — R9431 Abnormal electrocardiogram [ECG] [EKG]: Secondary | ICD-10-CM | POA: Diagnosis not present

## 2024-01-30 DIAGNOSIS — Z79899 Other long term (current) drug therapy: Secondary | ICD-10-CM | POA: Diagnosis not present

## 2024-01-30 DIAGNOSIS — M542 Cervicalgia: Secondary | ICD-10-CM | POA: Diagnosis not present

## 2024-01-30 DIAGNOSIS — G8929 Other chronic pain: Secondary | ICD-10-CM | POA: Diagnosis not present

## 2024-02-01 DIAGNOSIS — Z79899 Other long term (current) drug therapy: Secondary | ICD-10-CM | POA: Diagnosis not present

## 2024-03-04 DIAGNOSIS — Z79899 Other long term (current) drug therapy: Secondary | ICD-10-CM | POA: Diagnosis not present

## 2024-03-04 DIAGNOSIS — G8929 Other chronic pain: Secondary | ICD-10-CM | POA: Diagnosis not present

## 2024-03-04 DIAGNOSIS — R768 Other specified abnormal immunological findings in serum: Secondary | ICD-10-CM | POA: Diagnosis not present

## 2024-03-04 DIAGNOSIS — M542 Cervicalgia: Secondary | ICD-10-CM | POA: Diagnosis not present

## 2024-03-04 DIAGNOSIS — F17210 Nicotine dependence, cigarettes, uncomplicated: Secondary | ICD-10-CM | POA: Diagnosis not present

## 2024-03-04 DIAGNOSIS — J209 Acute bronchitis, unspecified: Secondary | ICD-10-CM | POA: Diagnosis not present

## 2024-03-04 DIAGNOSIS — Z6823 Body mass index (BMI) 23.0-23.9, adult: Secondary | ICD-10-CM | POA: Diagnosis not present

## 2024-03-04 DIAGNOSIS — F1290 Cannabis use, unspecified, uncomplicated: Secondary | ICD-10-CM | POA: Diagnosis not present

## 2024-03-04 DIAGNOSIS — M25551 Pain in right hip: Secondary | ICD-10-CM | POA: Diagnosis not present

## 2024-03-04 DIAGNOSIS — Z7689 Persons encountering health services in other specified circumstances: Secondary | ICD-10-CM | POA: Diagnosis not present

## 2024-03-06 DIAGNOSIS — Z79899 Other long term (current) drug therapy: Secondary | ICD-10-CM | POA: Diagnosis not present

## 2024-03-18 DIAGNOSIS — H04129 Dry eye syndrome of unspecified lacrimal gland: Secondary | ICD-10-CM | POA: Diagnosis not present

## 2024-03-18 DIAGNOSIS — M79672 Pain in left foot: Secondary | ICD-10-CM | POA: Diagnosis not present

## 2024-03-18 DIAGNOSIS — M2550 Pain in unspecified joint: Secondary | ICD-10-CM | POA: Diagnosis not present

## 2024-03-18 DIAGNOSIS — M79641 Pain in right hand: Secondary | ICD-10-CM | POA: Diagnosis not present

## 2024-03-18 DIAGNOSIS — M25559 Pain in unspecified hip: Secondary | ICD-10-CM | POA: Diagnosis not present

## 2024-03-18 DIAGNOSIS — M3500 Sicca syndrome, unspecified: Secondary | ICD-10-CM | POA: Diagnosis not present

## 2024-03-18 DIAGNOSIS — M549 Dorsalgia, unspecified: Secondary | ICD-10-CM | POA: Diagnosis not present

## 2024-03-18 DIAGNOSIS — M79671 Pain in right foot: Secondary | ICD-10-CM | POA: Diagnosis not present

## 2024-03-18 DIAGNOSIS — M1990 Unspecified osteoarthritis, unspecified site: Secondary | ICD-10-CM | POA: Diagnosis not present

## 2024-03-18 DIAGNOSIS — M79643 Pain in unspecified hand: Secondary | ICD-10-CM | POA: Diagnosis not present

## 2024-03-18 DIAGNOSIS — Z79899 Other long term (current) drug therapy: Secondary | ICD-10-CM | POA: Diagnosis not present

## 2024-03-18 DIAGNOSIS — R768 Other specified abnormal immunological findings in serum: Secondary | ICD-10-CM | POA: Diagnosis not present

## 2024-03-18 DIAGNOSIS — M542 Cervicalgia: Secondary | ICD-10-CM | POA: Diagnosis not present

## 2024-03-18 DIAGNOSIS — M79642 Pain in left hand: Secondary | ICD-10-CM | POA: Diagnosis not present

## 2024-03-28 DIAGNOSIS — Z114 Encounter for screening for human immunodeficiency virus [HIV]: Secondary | ICD-10-CM | POA: Diagnosis not present

## 2024-03-28 DIAGNOSIS — Z23 Encounter for immunization: Secondary | ICD-10-CM | POA: Diagnosis not present

## 2024-03-28 DIAGNOSIS — E559 Vitamin D deficiency, unspecified: Secondary | ICD-10-CM | POA: Diagnosis not present

## 2024-03-28 DIAGNOSIS — E7800 Pure hypercholesterolemia, unspecified: Secondary | ICD-10-CM | POA: Diagnosis not present

## 2024-03-28 DIAGNOSIS — Z131 Encounter for screening for diabetes mellitus: Secondary | ICD-10-CM | POA: Diagnosis not present

## 2024-03-28 DIAGNOSIS — R5383 Other fatigue: Secondary | ICD-10-CM | POA: Diagnosis not present

## 2024-03-28 DIAGNOSIS — Z1211 Encounter for screening for malignant neoplasm of colon: Secondary | ICD-10-CM | POA: Diagnosis not present

## 2024-03-28 DIAGNOSIS — D539 Nutritional anemia, unspecified: Secondary | ICD-10-CM | POA: Diagnosis not present

## 2024-03-28 DIAGNOSIS — R0602 Shortness of breath: Secondary | ICD-10-CM | POA: Diagnosis not present

## 2024-03-28 DIAGNOSIS — F17210 Nicotine dependence, cigarettes, uncomplicated: Secondary | ICD-10-CM | POA: Diagnosis not present

## 2024-03-28 DIAGNOSIS — R350 Frequency of micturition: Secondary | ICD-10-CM | POA: Diagnosis not present

## 2024-03-28 DIAGNOSIS — Z125 Encounter for screening for malignant neoplasm of prostate: Secondary | ICD-10-CM | POA: Diagnosis not present

## 2024-03-28 DIAGNOSIS — Z1159 Encounter for screening for other viral diseases: Secondary | ICD-10-CM | POA: Diagnosis not present

## 2024-04-01 DIAGNOSIS — F17210 Nicotine dependence, cigarettes, uncomplicated: Secondary | ICD-10-CM | POA: Diagnosis not present

## 2024-04-01 DIAGNOSIS — M5489 Other dorsalgia: Secondary | ICD-10-CM | POA: Diagnosis not present

## 2024-04-01 DIAGNOSIS — G8929 Other chronic pain: Secondary | ICD-10-CM | POA: Diagnosis not present

## 2024-04-01 DIAGNOSIS — M542 Cervicalgia: Secondary | ICD-10-CM | POA: Diagnosis not present

## 2024-04-01 DIAGNOSIS — Z79899 Other long term (current) drug therapy: Secondary | ICD-10-CM | POA: Diagnosis not present

## 2024-04-01 DIAGNOSIS — M25551 Pain in right hip: Secondary | ICD-10-CM | POA: Diagnosis not present

## 2024-04-05 DIAGNOSIS — Z79899 Other long term (current) drug therapy: Secondary | ICD-10-CM | POA: Diagnosis not present

## 2024-04-10 DIAGNOSIS — R300 Dysuria: Secondary | ICD-10-CM | POA: Diagnosis not present

## 2024-04-10 DIAGNOSIS — R0602 Shortness of breath: Secondary | ICD-10-CM | POA: Diagnosis not present

## 2024-04-10 DIAGNOSIS — R42 Dizziness and giddiness: Secondary | ICD-10-CM | POA: Diagnosis not present

## 2024-04-10 DIAGNOSIS — I10 Essential (primary) hypertension: Secondary | ICD-10-CM | POA: Diagnosis not present

## 2024-04-10 DIAGNOSIS — F17210 Nicotine dependence, cigarettes, uncomplicated: Secondary | ICD-10-CM | POA: Diagnosis not present

## 2024-04-15 DIAGNOSIS — R42 Dizziness and giddiness: Secondary | ICD-10-CM | POA: Diagnosis not present

## 2024-04-16 DIAGNOSIS — Z1211 Encounter for screening for malignant neoplasm of colon: Secondary | ICD-10-CM | POA: Diagnosis not present

## 2024-04-16 DIAGNOSIS — Z1212 Encounter for screening for malignant neoplasm of rectum: Secondary | ICD-10-CM | POA: Diagnosis not present

## 2024-04-21 LAB — COLOGUARD: COLOGUARD: NEGATIVE

## 2024-04-29 DIAGNOSIS — R7689 Other specified abnormal immunological findings in serum: Secondary | ICD-10-CM | POA: Diagnosis not present

## 2024-04-29 DIAGNOSIS — Z79899 Other long term (current) drug therapy: Secondary | ICD-10-CM | POA: Diagnosis not present

## 2024-04-29 DIAGNOSIS — M5489 Other dorsalgia: Secondary | ICD-10-CM | POA: Diagnosis not present

## 2024-04-29 DIAGNOSIS — M25551 Pain in right hip: Secondary | ICD-10-CM | POA: Diagnosis not present

## 2024-04-29 DIAGNOSIS — M542 Cervicalgia: Secondary | ICD-10-CM | POA: Diagnosis not present

## 2024-04-29 DIAGNOSIS — G8929 Other chronic pain: Secondary | ICD-10-CM | POA: Diagnosis not present

## 2024-04-29 DIAGNOSIS — Z6824 Body mass index (BMI) 24.0-24.9, adult: Secondary | ICD-10-CM | POA: Diagnosis not present

## 2024-04-29 DIAGNOSIS — F17210 Nicotine dependence, cigarettes, uncomplicated: Secondary | ICD-10-CM | POA: Diagnosis not present

## 2024-04-29 DIAGNOSIS — F1290 Cannabis use, unspecified, uncomplicated: Secondary | ICD-10-CM | POA: Diagnosis not present

## 2024-04-30 DIAGNOSIS — F17210 Nicotine dependence, cigarettes, uncomplicated: Secondary | ICD-10-CM | POA: Diagnosis not present

## 2024-04-30 DIAGNOSIS — Z87891 Personal history of nicotine dependence: Secondary | ICD-10-CM | POA: Diagnosis not present

## 2024-04-30 DIAGNOSIS — J449 Chronic obstructive pulmonary disease, unspecified: Secondary | ICD-10-CM | POA: Diagnosis not present

## 2024-05-01 DIAGNOSIS — Z79899 Other long term (current) drug therapy: Secondary | ICD-10-CM | POA: Diagnosis not present

## 2024-05-14 DIAGNOSIS — R059 Cough, unspecified: Secondary | ICD-10-CM | POA: Diagnosis not present

## 2024-05-14 DIAGNOSIS — B9689 Other specified bacterial agents as the cause of diseases classified elsewhere: Secondary | ICD-10-CM | POA: Diagnosis not present

## 2024-05-14 DIAGNOSIS — Z1152 Encounter for screening for COVID-19: Secondary | ICD-10-CM | POA: Diagnosis not present

## 2024-05-14 DIAGNOSIS — B379 Candidiasis, unspecified: Secondary | ICD-10-CM | POA: Diagnosis not present

## 2024-05-14 DIAGNOSIS — J329 Chronic sinusitis, unspecified: Secondary | ICD-10-CM | POA: Diagnosis not present

## 2024-05-16 ENCOUNTER — Emergency Department

## 2024-05-16 ENCOUNTER — Emergency Department
Admission: EM | Admit: 2024-05-16 | Discharge: 2024-05-16 | Discharge status: Against Medical Advice | Attending: Emergency Medicine | Admitting: Emergency Medicine

## 2024-05-16 ENCOUNTER — Other Ambulatory Visit: Payer: Self-pay

## 2024-05-16 DIAGNOSIS — R0602 Shortness of breath: Secondary | ICD-10-CM | POA: Insufficient documentation

## 2024-05-16 DIAGNOSIS — Z5321 Procedure and treatment not carried out due to patient leaving prior to being seen by health care provider: Secondary | ICD-10-CM | POA: Insufficient documentation

## 2024-05-16 DIAGNOSIS — J449 Chronic obstructive pulmonary disease, unspecified: Secondary | ICD-10-CM | POA: Diagnosis not present

## 2024-05-16 HISTORY — DX: Essential (primary) hypertension: I10

## 2024-05-16 LAB — CBC
HCT: 40.6 % (ref 39.0–52.0)
Hemoglobin: 13.3 g/dL (ref 13.0–17.0)
MCH: 31.4 pg (ref 26.0–34.0)
MCHC: 32.8 g/dL (ref 30.0–36.0)
MCV: 96 fL (ref 80.0–100.0)
Platelets: 183 K/uL (ref 150–400)
RBC: 4.23 MIL/uL (ref 4.22–5.81)
RDW: 14.1 % (ref 11.5–15.5)
WBC: 12.3 K/uL — ABNORMAL HIGH (ref 4.0–10.5)
nRBC: 0 % (ref 0.0–0.2)

## 2024-05-16 LAB — BASIC METABOLIC PANEL WITH GFR
Anion gap: 11 (ref 5–15)
BUN: 12 mg/dL (ref 8–23)
CO2: 26 mmol/L (ref 22–32)
Calcium: 9.2 mg/dL (ref 8.9–10.3)
Chloride: 96 mmol/L — ABNORMAL LOW (ref 98–111)
Creatinine, Ser: 0.63 mg/dL (ref 0.61–1.24)
GFR, Estimated: >60 mL/min (ref >60)
Glucose, Bld: 94 mg/dL (ref 70–99)
Potassium: 4.4 mmol/L (ref 3.5–5.1)
Sodium: 133 mmol/L — ABNORMAL LOW (ref 135–145)

## 2024-05-16 LAB — TROPONIN I (HIGH SENSITIVITY): Troponin I (High Sensitivity): 4 ng/L (ref <18)

## 2024-05-16 NOTE — ED Triage Notes (Signed)
 Patient states shortness of breath for a few weeks; recently treated for lung infection and asthma. Patient states symptoms have not improved.

## 2024-05-17 ENCOUNTER — Encounter (HOSPITAL_BASED_OUTPATIENT_CLINIC_OR_DEPARTMENT_OTHER): Payer: Self-pay

## 2024-05-17 ENCOUNTER — Other Ambulatory Visit: Payer: Self-pay

## 2024-05-17 ENCOUNTER — Emergency Department (HOSPITAL_BASED_OUTPATIENT_CLINIC_OR_DEPARTMENT_OTHER): Admitting: Radiology

## 2024-05-17 ENCOUNTER — Other Ambulatory Visit (HOSPITAL_BASED_OUTPATIENT_CLINIC_OR_DEPARTMENT_OTHER): Payer: Self-pay

## 2024-05-17 ENCOUNTER — Emergency Department (HOSPITAL_BASED_OUTPATIENT_CLINIC_OR_DEPARTMENT_OTHER)
Admission: EM | Admit: 2024-05-17 | Discharge: 2024-05-17 | Disposition: A | Discharge status: Home / Self Care | Attending: Emergency Medicine | Admitting: Emergency Medicine

## 2024-05-17 DIAGNOSIS — R918 Other nonspecific abnormal finding of lung field: Secondary | ICD-10-CM | POA: Diagnosis not present

## 2024-05-17 DIAGNOSIS — J441 Chronic obstructive pulmonary disease with (acute) exacerbation: Secondary | ICD-10-CM | POA: Diagnosis not present

## 2024-05-17 DIAGNOSIS — R0602 Shortness of breath: Secondary | ICD-10-CM | POA: Diagnosis not present

## 2024-05-17 DIAGNOSIS — Z87891 Personal history of nicotine dependence: Secondary | ICD-10-CM | POA: Insufficient documentation

## 2024-05-17 DIAGNOSIS — I10 Essential (primary) hypertension: Secondary | ICD-10-CM | POA: Diagnosis not present

## 2024-05-17 LAB — BASIC METABOLIC PANEL WITH GFR
Anion gap: 9 (ref 5–15)
BUN: 11 mg/dL (ref 8–23)
CO2: 28 mmol/L (ref 22–32)
Calcium: 9.7 mg/dL (ref 8.9–10.3)
Chloride: 96 mmol/L — ABNORMAL LOW (ref 98–111)
Creatinine, Ser: 0.62 mg/dL (ref 0.61–1.24)
GFR, Estimated: >60 mL/min (ref >60)
Glucose, Bld: 117 mg/dL — ABNORMAL HIGH (ref 70–99)
Potassium: 4.2 mmol/L (ref 3.5–5.1)
Sodium: 132 mmol/L — ABNORMAL LOW (ref 135–145)

## 2024-05-17 LAB — CBC
HCT: 40.3 % (ref 39.0–52.0)
Hemoglobin: 13.5 g/dL (ref 13.0–17.0)
MCH: 32 pg (ref 26.0–34.0)
MCHC: 33.5 g/dL (ref 30.0–36.0)
MCV: 95.5 fL (ref 80.0–100.0)
Platelets: 172 K/uL (ref 150–400)
RBC: 4.22 MIL/uL (ref 4.22–5.81)
RDW: 14.2 % (ref 11.5–15.5)
WBC: 8.1 K/uL (ref 4.0–10.5)
nRBC: 0 % (ref 0.0–0.2)

## 2024-05-17 LAB — D-DIMER, QUANTITATIVE: D-Dimer, Quant: 0.55 ug{FEU}/mL — ABNORMAL HIGH (ref 0.00–0.50)

## 2024-05-17 LAB — TROPONIN T, HIGH SENSITIVITY: Troponin T High Sensitivity: <15 ng/L (ref 0–19)

## 2024-05-17 LAB — PRO BRAIN NATRIURETIC PEPTIDE: Pro Brain Natriuretic Peptide: 177 pg/mL (ref <300.0)

## 2024-05-17 MED ORDER — PREDNISONE 10 MG PO TABS
40.0000 mg | ORAL_TABLET | Freq: Every day | ORAL | 0 refills | Status: DC
  Filled 2024-05-17: qty 20, 5d supply, fill #0

## 2024-05-17 MED ORDER — PREDNISONE 10 MG PO TABS: 40.0000 mg | ORAL_TABLET | Freq: Every day | ORAL | 0 refills | Status: AC

## 2024-05-17 MED ORDER — IPRATROPIUM-ALBUTEROL 0.5-2.5 (3) MG/3ML IN SOLN
3.0000 mL | Freq: Once | RESPIRATORY_TRACT | Status: AC
  Administered 2024-05-17: 3 mL via RESPIRATORY_TRACT
  Filled 2024-05-17: qty 3

## 2024-05-17 MED ORDER — DEXAMETHASONE SOD PHOSPHATE PF 10 MG/ML IJ SOLN
10.0000 mg | Freq: Once | INTRAMUSCULAR | Status: AC
  Administered 2024-05-17: 10 mg via INTRAVENOUS

## 2024-05-17 NOTE — ED Provider Notes (Signed)
 [place] EMERGENCY DEPARTMENT AT [facility] Provider Note   CSN: [id number] Arrival date & time: 05/17/24  1126     Patient presents with: Shortness of Breath   Scott Knight is a 68 y.o. male with 75-pack-year smoking history, COPD, hypertension, presents with concern for shortness of breath that has been ongoing for the past 2 to 3 weeks.  Reports that shortness of breath is more noticeable when he is up walking around.  No shortness of breath at rest.  He reports he has been having a productive cough and has been coughing up green appearing mucus.  No fever or chills.  He was given Augmentin 3 days ago for potential infection, but he does not report any improvement in symptoms.  Denies any chest pain.  Denies any pain or swelling in his legs.  No recent long plane or car rides, no recent hospitalizations or surgeries.  He has been taking his home albuterol inhaler as well as his Advair without improvement in symptoms.    Shortness of Breath      Prior to Admission medications   Medication Sig Start Date End Date Taking? Authorizing Provider  ADVAIR HFA 230-21 MCG/ACT inhaler Inhale 2 puffs into the lungs 2 (two) times daily. 05/01/24  Yes [provider]  amLODipine (NORVASC) 5 MG tablet Take 5 mg by mouth daily. 04/11/24  Yes [provider]  amoxicillin-clavulanate (AUGMENTIN) 875-125 MG tablet Take 1 tablet by mouth 2 (two) times daily. 05/14/24  Yes [provider]  hydroxychloroquine (PLAQUENIL) 200 MG tablet Take 400 mg by mouth See admin instructions. *6 times a week* 05/07/24  Yes [provider]  nystatin (MYCOSTATIN) 100000 UNIT/ML suspension Take 4-15 mLs by mouth. 05/14/24  Yes [provider]  Oxycodone HCl 10 MG TABS Take 10 mg by mouth 5 (five) times daily as needed. 04/29/24  Yes [provider]  predniSONE (DELTASONE) 10 MG tablet Take 4 tablets (40 mg total) by mouth daily for 5 days. 05/18/24 05/23/24 Yes  Veta Palma, PA-C  tiotropium (SPIRIVA) 18 MCG inhalation capsule Place 1 capsule into inhaler and inhale daily. 03/13/24  Yes [provider]  albuterol (VENTOLIN HFA) 108 (90 Base) MCG/ACT inhaler Inhale 2 puffs into the lungs every 4 (four) hours as needed.    [provider]  fluticasone-salmeterol (ADVAIR) 250-50 MCG/ACT AEPB Inhale 1 puff into the lungs 2 (two) times daily.    [provider]  lisinopril (ZESTRIL) 5 MG tablet Take 5 mg by mouth daily.    [provider]  pravastatin (PRAVACHOL) 20 MG tablet Take 20 mg by mouth daily.    [provider]    Allergies: Codeine    Review of Systems  Respiratory:  Positive for shortness of breath.     Updated Vital Signs BP 139/75   Pulse 76   Temp 98 F (36.7 C)   Resp 18   SpO2 96%   Physical Exam Vitals and nursing note reviewed.  Constitutional:      General: He is not in acute distress.    Appearance: He is well-developed.  HENT:     Head: Normocephalic and atraumatic.  Eyes:     Conjunctiva/sclera: Conjunctivae normal.  Cardiovascular:     Rate and Rhythm: Normal rate and regular rhythm.     Heart sounds: No murmur heard.    Comments: 2+ radial pulses bilaterally Pulmonary:     Effort: Pulmonary effort is normal. No respiratory distress.  Comments: Expiratory wheezing diffusely Able to talk in full sentences on room air without difficulty Abdominal:     Palpations: Abdomen is soft.     Tenderness: There is no abdominal tenderness.  Musculoskeletal:        General: No swelling.     Cervical back: Neck supple.     Right lower leg: No edema.     Left lower leg: No edema.     Comments: No lower extremity edema, no calf tenderness to palpation bilaterally  Skin:    General: Skin is warm and dry.     Capillary Refill: Capillary refill takes less than 2 seconds.  Neurological:     Mental Status: He is alert.  Psychiatric:        Mood and Affect: Mood normal.      (all labs ordered are listed, but only abnormal results are displayed) Labs Reviewed  BASIC METABOLIC PANEL WITH GFR - Abnormal; Notable for the following components:      Result Value   Sodium 132 (*)    Chloride 96 (*)    Glucose, Bld 117 (*)    All other components within normal limits  D-DIMER, QUANTITATIVE - Abnormal; Notable for the following components:   D-Dimer, Quant 0.55 (*)    All other components within normal limits  CBC  PRO BRAIN NATRIURETIC PEPTIDE  TROPONIN T, HIGH SENSITIVITY    EKG: EKG Interpretation Date/Time:  Friday May 17 2024 11:36:09 EST Ventricular Rate:  86 PR Interval:  178 QRS Duration:  144 QT Interval:  394 QTC Calculation: 471 R Axis:   107  Text Interpretation: Sinus rhythm with occasional Premature ventricular complexes and Premature atrial complexes Right bundle branch block No significant change since last tracing When compared with ECG of 16-May-2024 14:55, Premature ventricular complexes are now Present Premature atrial complexes are now Present Confirmed by Doretha Folks ([id number]) on 05/17/2024 2:16:11 PM  Radiology: DG Chest 2 View Result Date: 05/17/2024 EXAM: 2 VIEW(S) XRAY OF THE CHEST 05/17/2024 11:57:00 AM COMPARISON: 05/16/2024 CLINICAL HISTORY: shortness of breath FINDINGS: LUNGS AND PLEURA: Hyperinflated lungs consistent with COPD. Diffuse interstitial coarsening, likely related to chronic bronchitis. No pleural effusion. No pneumothorax. HEART AND MEDIASTINUM: EKG lead artifacts. No acute abnormality of the cardiac and mediastinal silhouettes. BONES AND SOFT TISSUES: Thoracic degenerative changes. No acute osseous abnormality. IMPRESSION: 1. Hyperinflated lungs consistent with COPD. 2. Diffuse interstitial coarsening, likely related to chronic bronchitis. Electronically signed by: Rockey Kilts MD 05/17/2024 01:10 PM EST RP Workstation: [id number]   DG Chest 2 View Result Date: 05/16/2024 CLINICAL DATA:  Shortness of  breath few weeks. Significant smoking history. EXAM: DG CHEST 2V COMPARISON:  None Available. FINDINGS: Lungs are adequately inflated with flattening of the hemidiaphragms on the lateral film. No focal lobar consolidation or effusion. Cardiomediastinal silhouette is normal. There are degenerative changes of the spine. IMPRESSION: 1. No acute cardiopulmonary disease. 2. COPD. Electronically Signed   By: Toribio Agreste M.D.   On: 05/16/2024 15:25     Procedures   Medications Ordered in the ED  ipratropium-albuterol (DUONEB) 0.5-2.5 (3) MG/3ML nebulizer solution 3 mL (3 mLs Nebulization Given 05/17/24 1435)  dexamethasone (DECADRON) injection 10 mg (10 mg Intravenous Given 05/17/24 1436)  ipratropium-albuterol (DUONEB) 0.5-2.5 (3) MG/3ML nebulizer solution 3 mL (3 mLs Nebulization Given 05/17/24 1501)  Medical Decision Making Amount and/or Complexity of Data Reviewed Labs: ordered. Radiology: ordered.  Risk Prescription drug management.     Differential diagnosis includes but is not limited to COPD exacerbation, CHF, ACS, arrhythmia, aortic aneurysm, pericarditis, myocarditis, pericardial effusion, cardiac tamponade, musculoskeletal pain, GERD, Boerhaave's syndrome, DVT/PE, pneumonia, pleural effusion   ED Course:  Upon initial evaluation, patient is well-appearing, no acute distress.  Normal vitals aside from elevated blood pressure 145/80 upon arrival.  He is reporting shortness of breath, but talking in full sentences on room air without difficulty.  Maintaining 94% oxygen saturation room air.  Lungs with diffuse wheezing noted.  Regular rate and rhythm on cardiac auscultation.  No edema in the lower extremities.   Labs Ordered: I Ordered, and personally interpreted labs.  The pertinent results include:   CBC within normal limits BMP with hyponatremia 132, otherwise unremarkable Troponin within normal limits proBNP within normal limits  Imaging  Studies ordered: I ordered imaging studies including chest x-ray I independently visualized the imaging with scope of interpretation limited to determining acute life threatening conditions related to emergency care. Imaging showed  IMPRESSION:  1. Hyperinflated lungs consistent with COPD.  2. Diffuse interstitial coarsening, likely related to chronic bronchitis.   I agree with the radiologist interpretation   Cardiac Monitoring: / EKG: The patient was maintained on a cardiac monitor.  I personally viewed and interpreted the cardiac monitored which showed an underlying rhythm of: Normal sinus rhythm with PVCs and PACs  Medications Given: Prednisone DuoNeb  Upon re-evaluation, patient reports his shortness of breath feels better with the DuoNebs and prednisone given.   Low concern for ACS at this time given troponin less than 15, no chest pain, and EKG with normal sinus rhythm and no ST changes.  Low concern for DVT or PE at this time given D-dimer within normal limits for age.  His proBNP is within normal limits, no edema on exam, no pulmonary edema noted on chest x-ray, low concern for heart failure.  No consolidations on chest x-ray, no fevers or chills, lower concern for infectious etiology such as pneumonia or viral URI. Suspect symptoms due to COPD exacerbation given COPD changes noted on his chest x-ray and chronicity of symptoms for the past couple of weeks.  He does feel improved with the DuoNeb and prednisone which does fit with COPD exacerbation.  He is maintaining 96% oxygen saturation on room air.  Talking without difficulty.  Ambulating without difficulty.  Stable and appropriate for discharge home.    Impression: COPD exacerbation  Disposition:  The patient was discharged home with instructions to take 5-day course of prednisone as prescribed.  Continue to take his Advair inhaler at home for his COPD.  Follow-up with PCP or pulmonologist within the next 2 weeks for recheck of  symptoms. Return precautions given and patient verbalized understanding.    This chart was dictated using voice recognition software, Dragon. Despite the best efforts of this provider to proofread and correct errors, errors may still occur which can change documentation meaning.       Final diagnoses:  COPD exacerbation [facility])    ED Discharge Orders          Ordered    predniSONE (DELTASONE) 10 MG tablet  Daily        05/17/24 1621               Veta Palma, PA-C 05/17/24 1622    Yolande Lamar BROCKS, MD 05/21/24 2324

## 2024-05-17 NOTE — ED Triage Notes (Signed)
 Patient reports shortness of breath. He said it has been going on for a few weeks. He said he has history of asthma. Was seen and given antibiotics. He went to [place] last night but left because of the wait.

## 2024-05-17 NOTE — Discharge Instructions (Signed)
 Your shortness of breath is likely due to a COPD exacerbation today.  You have been prescribed prednisone which is an anti-inflammatory to help your lungs. Please take this medication as prescribed for the next 5 days starting tomorrow. Take this medication in the morning with breakfast, as taking it at night may make it hard to sleep.  Please continue taking your Advair inhaler (fluticasone-salmeterol) as prescribed.  This is your maintenance medication help with your COPD.  Please follow-up with your PCP or pulmonologist within the next 2 weeks for recheck of your symptoms and further management of your COPD.  Remainder of your workup is reassuring today.  Your blood counts and electrolytes were normal.  No signs of a heart attack on your labs.  No signs of a blood clot.  No signs of heart failure.

## 2024-05-28 DIAGNOSIS — R7689 Other specified abnormal immunological findings in serum: Secondary | ICD-10-CM | POA: Diagnosis not present

## 2024-05-28 DIAGNOSIS — G8929 Other chronic pain: Secondary | ICD-10-CM | POA: Diagnosis not present

## 2024-05-28 DIAGNOSIS — M25551 Pain in right hip: Secondary | ICD-10-CM | POA: Diagnosis not present

## 2024-05-28 DIAGNOSIS — F1290 Cannabis use, unspecified, uncomplicated: Secondary | ICD-10-CM | POA: Diagnosis not present

## 2024-05-28 DIAGNOSIS — M5489 Other dorsalgia: Secondary | ICD-10-CM | POA: Diagnosis not present

## 2024-05-28 DIAGNOSIS — Z6823 Body mass index (BMI) 23.0-23.9, adult: Secondary | ICD-10-CM | POA: Diagnosis not present

## 2024-05-28 DIAGNOSIS — J449 Chronic obstructive pulmonary disease, unspecified: Secondary | ICD-10-CM | POA: Diagnosis not present

## 2024-05-28 DIAGNOSIS — M542 Cervicalgia: Secondary | ICD-10-CM | POA: Diagnosis not present

## 2024-05-28 DIAGNOSIS — F17210 Nicotine dependence, cigarettes, uncomplicated: Secondary | ICD-10-CM | POA: Diagnosis not present

## 2024-05-30 DIAGNOSIS — Z79899 Other long term (current) drug therapy: Secondary | ICD-10-CM | POA: Diagnosis not present
